# Patient Record
Sex: Male | Born: 1935 | Race: White | Hispanic: No | Marital: Married | Smoking: Never smoker
Health system: Southern US, Community
[De-identification: ages and names within clinical notes are randomized; demographics above are authoritative.]

## PROBLEM LIST (undated history)

## (undated) DIAGNOSIS — K219 Gastro-esophageal reflux disease without esophagitis: Secondary | ICD-10-CM

## (undated) DIAGNOSIS — I639 Cerebral infarction, unspecified: Secondary | ICD-10-CM

## (undated) DIAGNOSIS — E119 Type 2 diabetes mellitus without complications: Secondary | ICD-10-CM

## (undated) DIAGNOSIS — IMO0001 Reserved for inherently not codable concepts without codable children: Secondary | ICD-10-CM

## (undated) HISTORY — PX: COLON SURGERY: SHX602

## (undated) HISTORY — DX: Gastro-esophageal reflux disease without esophagitis: K21.9

## (undated) HISTORY — DX: Reserved for inherently not codable concepts without codable children: IMO0001

## (undated) HISTORY — DX: Cerebral infarction, unspecified: I63.9

## (undated) HISTORY — DX: Type 2 diabetes mellitus without complications: E11.9

---

## 2004-12-28 ENCOUNTER — Other Ambulatory Visit: Payer: Self-pay

## 2004-12-30 ENCOUNTER — Inpatient Hospital Stay: Payer: Self-pay | Admitting: Internal Medicine

## 2005-01-05 ENCOUNTER — Encounter: Payer: Self-pay | Admitting: Internal Medicine

## 2005-01-21 ENCOUNTER — Ambulatory Visit: Payer: Self-pay | Admitting: Internal Medicine

## 2005-01-24 ENCOUNTER — Encounter: Payer: Self-pay | Admitting: Internal Medicine

## 2005-12-11 ENCOUNTER — Other Ambulatory Visit: Payer: Self-pay

## 2005-12-11 ENCOUNTER — Inpatient Hospital Stay: Payer: Self-pay | Admitting: Internal Medicine

## 2007-09-03 ENCOUNTER — Other Ambulatory Visit: Payer: Self-pay

## 2007-09-03 ENCOUNTER — Emergency Department: Payer: Self-pay | Admitting: Emergency Medicine

## 2007-09-05 ENCOUNTER — Emergency Department: Payer: Self-pay | Admitting: Emergency Medicine

## 2007-09-05 ENCOUNTER — Inpatient Hospital Stay (HOSPITAL_COMMUNITY): Admission: AD | Admit: 2007-09-05 | Discharge: 2007-09-14 | Payer: Self-pay | Admitting: *Deleted

## 2007-09-06 ENCOUNTER — Encounter (INDEPENDENT_AMBULATORY_CARE_PROVIDER_SITE_OTHER): Payer: Self-pay | Admitting: *Deleted

## 2007-09-06 ENCOUNTER — Encounter (INDEPENDENT_AMBULATORY_CARE_PROVIDER_SITE_OTHER): Payer: Self-pay | Admitting: Neurology

## 2007-09-08 ENCOUNTER — Ambulatory Visit: Payer: Self-pay | Admitting: Vascular Surgery

## 2007-09-09 ENCOUNTER — Ambulatory Visit: Payer: Self-pay | Admitting: Physical Medicine & Rehabilitation

## 2007-09-14 ENCOUNTER — Ambulatory Visit: Payer: Self-pay | Admitting: Gastroenterology

## 2007-11-10 ENCOUNTER — Ambulatory Visit: Payer: Self-pay | Admitting: Urology

## 2007-11-14 ENCOUNTER — Ambulatory Visit: Payer: Self-pay | Admitting: Urology

## 2008-01-13 ENCOUNTER — Encounter: Payer: Self-pay | Admitting: Family Medicine

## 2008-01-25 ENCOUNTER — Encounter: Payer: Self-pay | Admitting: Family Medicine

## 2008-02-25 ENCOUNTER — Encounter: Payer: Self-pay | Admitting: Family Medicine

## 2008-03-23 ENCOUNTER — Emergency Department: Payer: Self-pay | Admitting: Emergency Medicine

## 2008-05-02 ENCOUNTER — Ambulatory Visit: Payer: Self-pay | Admitting: Family Medicine

## 2008-05-04 ENCOUNTER — Ambulatory Visit: Payer: Self-pay | Admitting: Family Medicine

## 2008-05-09 ENCOUNTER — Ambulatory Visit: Payer: Self-pay | Admitting: Family Medicine

## 2008-10-01 ENCOUNTER — Ambulatory Visit: Payer: Self-pay | Admitting: Family Medicine

## 2009-04-04 ENCOUNTER — Ambulatory Visit: Payer: Self-pay | Admitting: Gastroenterology

## 2009-04-09 ENCOUNTER — Ambulatory Visit: Payer: Self-pay | Admitting: Gastroenterology

## 2009-04-11 ENCOUNTER — Ambulatory Visit: Payer: Self-pay | Admitting: Gastroenterology

## 2009-04-22 ENCOUNTER — Inpatient Hospital Stay: Payer: Self-pay | Admitting: Surgery

## 2010-12-09 NOTE — H&P (Signed)
NAMEMANLY, NESTLE               ACCOUNT NO.:  [id number]   MEDICAL RECORD NO.:  [id number]          PATIENT TYPE:  INP   LOCATION:  3113                         FACILITY:  [facility]   PHYSICIAN:  Bevelyn Buckles. Champey, M.D.DATE OF BIRTH:  05/21/1921   DATE OF ADMISSION:  09/05/2007  DATE OF DISCHARGE:                              HISTORY & PHYSICAL   NEUROLOGY STROKE ADMIT NOTE   REQUESTING PHYSICIAN:  Dr. Christin Fudge from [facility].   REASON FOR ADMISSION:  Intercerebral hemorrhage.   HPI:  Mr. Blizard is an 77 year old Caucasian male with multiple medical  problems who initially presented to outside hospital today with  confusion and lethargy and weakness with inability to walk.  Patient was  found by his wife sitting on the floor unable to get up and walk.  He  went to an outside hospital where CT showed a large right frontal and  cerebral hemorrhage with edema and small vessel disease.  There was no  trauma documented by the family or patient.  He was transferred to [facility] for further care and treatment.  Patient is feeling well.  He denies any headache, focal weakness, numbness, vision changes, speech  changes, swallowing problems, chewing problems, dizziness, vertigo,  falls, or loss of consciousness.  Patient's INR at outside hospital was  3.9 and was given FFP x1 and vitamin K.  Patient was also at the outside  hospital on Saturday for lethargy and weakness, diagnosed with urinary  tract infection, and treated with antibiotics.   PAST MEDICAL HISTORY:  Positive for:  1. Atrial fibrillation.  2. Cerebral hemorrhage.   CURRENT MEDICATIONS:  1. Diltiazem.  2. Warfarin.  3. Nexium.  4. Multivitamin.   ALLERGIES:  PENICILLIN.   FAMILY HISTORY:  Noncontributory.   SOCIAL HISTORY:  Patient lives with wife.  Denies any tobacco or alcohol  use.   REVIEW OF SYSTEMS:  Positives as per HPI.  Review of systems negative as  per HPI in greater than 7  other organ systems.   EXAMINATION:  VITALS:  Temperature is 98.2.  Blood pressure is 161/60.  Pulse is 80.  Respirations 16.  O2 sat is 100%.  HEENT:  Normocephalic, atraumatic.  Extraocular muscles are intact.  Pupils equal, round, and reactive to light.  NECK:  Supple.  No carotid bruits are heard.  HEART:  Regular.  LUNGS:  Clear.  ABDOMEN:  Soft.  EXTREMITIES:  Show good pulses.  NEUROLOGICAL EXAMINATION:  Patient is awake, alert, language is fluent.  Patient is following commands appropriately.  Cranial nerves II-XII are  grossly intact.  MOTOR EXAMINATION:  Shows good strength in all 4 extremities.  No drift  is noted.SENSORY EXAMINATION:  Within normal limits to light touch.  Reflexes are trace to 1+ and symmetric.  Cerebellar function is within  normal limits, finger-to-nose.  Gait was not assessed secondary to  safety.   CT of the head at the outside hospital which was reviewed showed large  right frontal and cerebral hemorrhage with midline shift, edema.   LABS:  WBC is 12.1, hemoglobin 13.7, hematocrit is  40.7, platelets 275,  CK is 63, troponin is less than 0.01, TSH is 0.3, PT was 39.8, and INR  was 3.9 (this was prior to FFP and vitamin K given), sodium is 139,  potassium is 3.9, chloride is 102, CO2 is 28, BUN 25, creatinine is  1.54, glucose is 143, calcium is 9.0.  Her LFTs were within normal  limits.   IMPRESSION:  This is an 77 year old with right frontal and cerebral  hemorrhage transferred from an outside hospital for further care and  treatment.  We will admit the patient under stroke MD service and put  him in the intensive care unit with frequent neuro checks.  We will gets  the stats PT and INR since he is status post vitamin K and fresh frozen  plasma.  We will also get a stat CT of the head and compare with outside  hospital.  Patient looks remarkably well for this size of the hemorrhage  seen at the outside hospital.  We have consulted Dr. Franky Macho,   neurosurgery, for evaluation of intracerebral hemorrhage.  We will hold  all anticoagulation and antiplatelets.  We will follow his PT/INR.  We  will check an MRI of the brain.  Get physical therapy/occupational  therapy consults.  Keep the patient n.p.o. until he passes swallow  evaluation.  Place patient on intravenous fluids at normal saline at 100  mL per hour.  Can checks lipids and homocystine level.  Get 2D  echocardiogram and carotid Dopplers and I follow the patient while he is  in the hospital.      Bevelyn Buckles. Nash Shearer, M.D.  Electronically Signed     DRC/MEDQ  D:  09/05/2007  T:  09/06/2007  Job:  [id number]

## 2010-12-09 NOTE — Consult Note (Signed)
[name], [name]               ACCOUNT NO.:  [id number]   MEDICAL RECORD NO.:  [id number]          PATIENT TYPE:  INP   LOCATION:  3113                         FACILITY:  [facility]   PHYSICIAN:  Coletta Memos, M.D.     DATE OF BIRTH:  Feb 03, 1921   DATE OF CONSULTATION:  09/05/2007  DATE OF DISCHARGE:                                 CONSULTATION   REFERRING PHYSICIAN:  Bevelyn Buckles. Champey, MD   CHIEF COMPLAINT:  Right frontal intracerebral hematoma.   INDICATIONS:  [name] is an 77 year old gentleman who has long  history of medical problems including intracerebral hematomas and atrial  fibrillation who 2 days prior to admission to [facility] was seen for  weakness, and he was diagnosed with a urinary tract infection.  No  radiological evaluations were done at that time.  He was brought back to  the hospital by his wife stating that she had found him sitting on the  floor of the bathroom next to the toilet.  He denied falling.  Head CT  at [facility] showed a large intracerebral hematoma.  He has had these  symptoms since Saturday and has not worsened but did not show any  improvement.  He is on Coumadin.  He has an INR of 3.9 and a PT of 39.8.  He takes the Coumadin due to his history of atrial fibrillation.  His  wife felt that he was also weak on his left side and had some difficulty  walking.   His initial examination at [facility] showed he was alert, aroused,  answering questions, and he was appropriate.  No abnormalities were  identified with regards to that.  He was given vitamin K, labetalol and  acetaminophen.   He was transferred to the [facility] Stroke Service, and I was consulted  by Dr. Nash Shearer.   CURRENT MEDICATIONS:  Include diltiazem, Pacerone, Coumadin, Nexium,  vitamins Nature Plus, and he is taking an antibiotic for his urinary  tract infection.  Wife did not know the name of that.  He was also  placed on Phenergan and p.r.n. labetalol by the stroke service.   ALLERGIES:  PENICILLIN.   SOCIAL HISTORY:  He lives with his wife, does not smoke, does not use  alcohol.   PHYSICAL EXAMINATION:  VITAL SIGNS:  Blood pressure 156/60, pulse 80,  respiratory rate 23, oxygen saturation 99% on 2 L of O2 via nasal  cannula, temperature 100 degrees F.  NEUROLOGIC:  He is alert and oriented x4.  He answers all questions.  Speech is clear and fluent.  Mild drift in the left upper extremity.  He  has 5/5 strength in both upper and lower extremities.  Sensation is  intact to light touch.  Pupils are equal, round and reactive to light,  full extraocular movements.  He has symmetric facies, symmetric facial  movements.  Tongue and uvula are in the midline.  Hearing is intact to  voice.  NECK:  No cervical masses or bruits are appreciated.  LUNGS:  Lung fields are clear.  HEART:  Irregularly irregular rhythm.  ABDOMEN:  Soft,  nontender.  ADDITIONAL NEUROLOGIC:  Muscle tone and bulk are normal.  Coordination  appears to be normal.   Head CT findings are as aforementioned.   [name] will have a repeat head CT.  Head CT does show significant  amount of shift effacement of the right frontal horn, but his basal  cisterns are widely patent.  The shift is all more rostral and higher  up.  No skull fractures are seen.   ASSESSMENT/PLAN:  Right frontal intracerebral hematuria, unknown  etiology.  Essentially, it is in a place which is known for  hypertension.  He may have had an embolic phenomenon given the atrial  fibrillation, but he certainly was fully Coumadinized.   I do not believe that at this time he needs surgical decompression.  I  think the benefits would be greater than possible risks.  He is still  anticoagulated to some degree, not having enough __________, having any  sort of tentorial herniation, nor do I think it is likely he will have  that unless the clot were to change.  He has already received some  vitamin K, and I believe he will be  transfused fresh frozen plasma by  the stroke service.  I will follow [name].  He is due to receive a  repeat head CT tomorrow.           ______________________________  Coletta Memos, M.D.     KC/MEDQ  D:  09/05/2007  T:  09/06/2007  Job:  [id number]   cc:   Bevelyn Buckles. Nash Shearer, M.D.

## 2010-12-09 NOTE — Procedures (Signed)
CLINICAL HISTORY:  This is an 77 year old patient with a right frontal  hemorrhage.  The patient is confused and lethargic.  EEG done to  evaluate for seizure activity.  Medication listed Cardizem,  multivitamin, vitamin K, Avelox, Normodyne, Phenergan and Tylenol.   This is a portable study with the patient described as being lethargic.  Background rhythm consists of 6-7 Hz theta which is of moderate  amplitude and symmetric in both hemispheres.  Intermittent 5-6 Hz theta  slowing is seen focally in the left frontal and temporal head regions.  No definite epileptiform activity spikes are noted.  The patient did not  seem to be awake during this recording.  Length of the recording 22.8  minutes.  Technical component is average.  Hyperventilation is not  performed.  Photic stimulation unremarkable.   IMPRESSION:  This EEG is abnormal due to presence of bihemispheric  dysfunction as well as focal right hemispheric slowing suggestive of  underlying structural lesion.  No definite epileptiform features were  noted.           ______________________________  Sunny Schlein. Pearlean Brownie, MD     ZOX:WRUE  D:  09/06/2007 18:54:37  T:  09/08/2007 09:41:31  Job #:  [id number]

## 2010-12-09 NOTE — Discharge Summary (Signed)
NAMEFRITZ, [name]               ACCOUNT NO.:  [id number]   MEDICAL RECORD NO.:  [id number]          PATIENT TYPE:  INP   LOCATION:  3034                         FACILITY:  [facility]   PHYSICIAN:  Marlan Palau, M.D.  DATE OF BIRTH:  1920-08-14   DATE OF ADMISSION:  09/05/2007  DATE OF DISCHARGE:  09/14/2007                               DISCHARGE SUMMARY   DISCHARGE DIAGNOSES:  1. Right frontal intercerebral hemorrhage with left hematemesis      secondary to hypertension and warfarin as well as amyloid.  2. Ileus secondary to immobility in hospital, resolved.  3. Urinary retention, discharge with Foley.  4. Hypertension.  5. Atrial fibrillation.  6. History of cerebral hemorrhage.  7. Gastroesophageal reflux disease.   DISCHARGE MEDICATIONS:  1. Multivitamin a day.  2. Cardizem 120 mg ER a day.  3. Protonix 40 mg a day.  4. MiraLax 17 grams a day.  5. Flomax 0.4 mg a day.   STUDIES PERFORMED:  1. CT of the brain on admission shows right frontal hematoma with      vasogenic edema, mass effect with right-to-left shift.  2. MRI of the brain shows a large 56 x 38 x 37 mm vasogenic hemorrhage      with surrounding edema thought to be amyloid angiopathy event.  A 1      cm midline shift on this study compared to prior CT.  No abnormal      enhancement to suggest hemorrhagic neoplasm.  3. MRA of the head shows no proximal stenosis or vascular malformation      identified.  4. CT of the brain at 48 hours shows signs of 4.55 x 5.7 cm not      significantly changed since study two days ago.  The leftward      midline shift is measured at 11 mm with mass effect on the right      lateral ventricle.  Small volume intraventricular hemorrhage and      mild enlargement of the temporal horns.  5. Abdominal x-ray that shows marked colonic distention at least 14      cm.  6. Follow-up abdominal x-ray after rectal tube shows decompression of      the entire colon, no acute abnormalities.  7. EEG shows bihemispheric dysfunction and focal right hemispheric      slowing suggestive of underlying structural lesion, no epileptiform      activities.  8. Carotid Doppler shows no ICA stenosis, left vertebral artery flow      antegrade, right vertebral artery not seen.  9. Lower extremity Dopplers are normal bilaterally at rest.  Great toe      pressures indicate adequate perfusion bilaterally.  10.A 2-D echocardiogram shows EF of 55-65% with no left ventricular      regional wall motion abnormalities.  No embolic source.  11.EKG not present in chart.   LABORATORY STUDIES:  CBC with hemoglobin 11.5, hematocrit 34, otherwise  normal.  Chemistry with potassium 3.4, glucose 125, otherwise normal.  Last INR performed on September 08, 2007, was 1.3.   HISTORY OF PRESENT  ILLNESS:  [name] is an 77 year old Caucasian male  with multiple medical problems who presented to an outside hospital with  confusion and lethargy and weakness with inability to walk.  The patient  was found by his wife sitting on the floor unable to get up and walk.  At the outside hospital, CT showed a large right frontal and cerebral  hemorrhage with edema and small vessel disease.  There was no trauma  documented by the patient or family.  He was transferred to [facility] for further care and treatment.  Upon arrival,  the patient states he is feeling well.  His INR at the outside hospital  was 3.9 and he was given fresh frozen plasma x1 and vitamin K.  Of note,  the patient was also at the outside hospital on Saturday for lethargy  and weakness and he was diagnosed with urinary tract infection and  placed on antibiotics.  We will admit him to the neuro intensive care  unit and continued to reverse his INR was vitamin K and fresh frozen  plasma.  Dr. Franky Macho, neurosurgeon, has been consulted to evaluate the  hemorrhage.  He was not a TPA candidate secondary to his large  hemorrhage.    HOSPITAL COURSE:  There was some increase in hemorrhage size within the  first 24-48 hours but not significant or with neuro change.  He was  given I think a total of 5 units of fresh frozen plasma and vitamin K  with eventual reversal of INR down to 1.3.  The neurosurgeon continue to  follow and throughout hospitalization.  However he never had a need for  surgical intervention.  They were seriously considering craniotomy at  one-time but as his neuro status improved, that was stopped.  The  patient was then transferred to the floor.  Modified barium swallow was  performed and found patient needed a dysphasia II chopped meat, thin  liquid diet.  He was evaluated by PT, OT and speech therapy and felt to  be a great candidate for inpatient rehab.  However, his family is unable  to provide care at time of discharge to SNF was opted for.  While  determining discharge plan, the patient developed severe ileus measuring  14 cm at risk for rupture.  Rectal tube was placed and rapidly  decompressed the colon.  GI was consulted.  They felt the ileus was  secondary to immobility due to hospitalization and would resolve with  medical intervention only.  At the time of discharge the patient is  tolerating a diet and his bowels have moved.  The patient also with  difficulty with urinary retention likely due to Foley insertion and  removal as well as his age.  We have added Flomax during  hospitalization.  At nursing home request, will place Foley at discharge  and they will begin bladder retraining program there.  Patient is stable  for discharge.  Bed obtained and arrangements made.   CONDITION ON DISCHARGE:  The patient alert and cooperative.  He moves  all extremities.  He has full visual fields.  He follows commands.  His  chest is clear to auscultation.  His heart rate is regular.  He has mild  left facial droop with left hematemesis that is mild.  His abdomen is  soft, large, nondistended and  with decreased bowel sounds.  His bladder  is nondistended.   DISCHARGE/PLAN:  1. Discharge to skilled nursing facility for continuation  of PT, OT      and speech therapy for stroke recovery.  2. No antiplatelets at this point secondary to hemorrhage.  3. Monitor p.o. intake and bowel movements secondary to recent ileus.  4. Patient needs bladder retraining for urinary retention.  The      patient discharged with Foley.  5. Follow-up with primary care physician at skilled nursing facility      for risk factor control.  6. Follow-up Pramod P. Pearlean Brownie, MD, in his office in 2-3 months.      Annie Main, N.P.      Marlan Palau, M.D.  Electronically Signed    SB/MEDQ  D:  09/14/2007  T:  09/14/2007  Job:  [id number]   cc:   Pramod P. Pearlean Brownie, MD  Coletta Memos, M.D.  Rachael Fee, MD  Bjorn Pippin, M.D.

## 2011-04-17 LAB — CBC
Hemoglobin: 11.5 — ABNORMAL LOW
MCHC: 33.9
MCV: 92.4
Platelets: 229
RBC: 3.67 — ABNORMAL LOW
RDW: 13.3
WBC: 12.7 — ABNORMAL HIGH

## 2011-04-17 LAB — URINE MICROSCOPIC-ADD ON

## 2011-04-17 LAB — TYPE AND SCREEN

## 2011-04-17 LAB — URINE CULTURE
Colony Count: NO GROWTH
Culture: NO GROWTH

## 2011-04-17 LAB — PREPARE FRESH FROZEN PLASMA

## 2011-04-17 LAB — COMPREHENSIVE METABOLIC PANEL
AST: 27
Albumin: 3.7
BUN: 20
Calcium: 9.1
Creatinine, Ser: 1.29
GFR calc Af Amer: >60
Total Protein: 7

## 2011-04-17 LAB — LIPID PANEL
Cholesterol: 143
HDL: 36 — ABNORMAL LOW
LDL Cholesterol: 92
Total CHOL/HDL Ratio: 4

## 2011-04-17 LAB — PROTIME-INR
INR: 1.4
INR: 1.6 — ABNORMAL HIGH
INR: 2.4 — ABNORMAL HIGH
Prothrombin Time: 16.7 — ABNORMAL HIGH
Prothrombin Time: 26.6 — ABNORMAL HIGH

## 2011-04-17 LAB — CULTURE, BLOOD (ROUTINE X 2): Culture: NO GROWTH

## 2011-04-17 LAB — BASIC METABOLIC PANEL
CO2: 27
Calcium: 8.4
GFR calc Af Amer: >60
GFR calc non Af Amer: >60
Potassium: 3.4 — ABNORMAL LOW
Sodium: 144

## 2011-04-17 LAB — URINALYSIS, ROUTINE W REFLEX MICROSCOPIC
Glucose, UA: NEGATIVE
Ketones, ur: NEGATIVE
Leukocytes, UA: NEGATIVE

## 2011-04-17 LAB — PHOSPHORUS: Phosphorus: 3.2

## 2011-04-17 LAB — CALCIUM: Calcium: 8.3 — ABNORMAL LOW

## 2011-04-17 LAB — APTT
aPTT: 47 — ABNORMAL HIGH
aPTT: 47 — ABNORMAL HIGH

## 2011-06-08 ENCOUNTER — Ambulatory Visit: Payer: Self-pay | Admitting: Family Medicine

## 2011-07-01 ENCOUNTER — Ambulatory Visit: Payer: Self-pay | Admitting: Surgery

## 2011-07-04 ENCOUNTER — Inpatient Hospital Stay: Payer: Self-pay | Admitting: Surgery

## 2011-07-07 LAB — PATHOLOGY REPORT

## 2012-01-22 ENCOUNTER — Encounter (INDEPENDENT_AMBULATORY_CARE_PROVIDER_SITE_OTHER): Payer: Medicare Other | Admitting: Ophthalmology

## 2012-01-22 DIAGNOSIS — H3530 Unspecified macular degeneration: Secondary | ICD-10-CM

## 2012-01-22 DIAGNOSIS — H43819 Vitreous degeneration, unspecified eye: Secondary | ICD-10-CM

## 2012-01-22 DIAGNOSIS — H2510 Age-related nuclear cataract, unspecified eye: Secondary | ICD-10-CM

## 2012-07-29 ENCOUNTER — Ambulatory Visit (INDEPENDENT_AMBULATORY_CARE_PROVIDER_SITE_OTHER): Payer: Medicare Other | Admitting: Ophthalmology

## 2012-07-29 DIAGNOSIS — E1139 Type 2 diabetes mellitus with other diabetic ophthalmic complication: Secondary | ICD-10-CM

## 2012-07-29 DIAGNOSIS — H3530 Unspecified macular degeneration: Secondary | ICD-10-CM

## 2012-07-29 DIAGNOSIS — H35039 Hypertensive retinopathy, unspecified eye: Secondary | ICD-10-CM

## 2012-07-29 DIAGNOSIS — E11319 Type 2 diabetes mellitus with unspecified diabetic retinopathy without macular edema: Secondary | ICD-10-CM

## 2012-07-29 DIAGNOSIS — I10 Essential (primary) hypertension: Secondary | ICD-10-CM

## 2012-07-29 DIAGNOSIS — H43819 Vitreous degeneration, unspecified eye: Secondary | ICD-10-CM

## 2013-01-23 ENCOUNTER — Ambulatory Visit: Payer: Self-pay | Admitting: Family Medicine

## 2013-07-31 ENCOUNTER — Ambulatory Visit (INDEPENDENT_AMBULATORY_CARE_PROVIDER_SITE_OTHER): Payer: Medicare Other | Admitting: Ophthalmology

## 2013-07-31 DIAGNOSIS — H3530 Unspecified macular degeneration: Secondary | ICD-10-CM

## 2013-07-31 DIAGNOSIS — H43819 Vitreous degeneration, unspecified eye: Secondary | ICD-10-CM

## 2013-07-31 DIAGNOSIS — I10 Essential (primary) hypertension: Secondary | ICD-10-CM

## 2013-07-31 DIAGNOSIS — H35039 Hypertensive retinopathy, unspecified eye: Secondary | ICD-10-CM

## 2013-07-31 DIAGNOSIS — E1139 Type 2 diabetes mellitus with other diabetic ophthalmic complication: Secondary | ICD-10-CM

## 2013-07-31 DIAGNOSIS — E1165 Type 2 diabetes mellitus with hyperglycemia: Secondary | ICD-10-CM

## 2013-07-31 DIAGNOSIS — E11319 Type 2 diabetes mellitus with unspecified diabetic retinopathy without macular edema: Secondary | ICD-10-CM

## 2013-08-04 ENCOUNTER — Ambulatory Visit (INDEPENDENT_AMBULATORY_CARE_PROVIDER_SITE_OTHER): Payer: Medicare Other | Admitting: Ophthalmology

## 2013-11-13 ENCOUNTER — Ambulatory Visit (INDEPENDENT_AMBULATORY_CARE_PROVIDER_SITE_OTHER): Payer: Medicare Other | Admitting: Podiatry

## 2013-11-13 ENCOUNTER — Encounter: Payer: Self-pay | Admitting: Podiatry

## 2013-11-13 VITALS — BP 132/78 | HR 88 | Resp 16 | Ht 72.0 in | Wt 235.0 lb

## 2013-11-13 DIAGNOSIS — M79609 Pain in unspecified limb: Secondary | ICD-10-CM

## 2013-11-13 DIAGNOSIS — B351 Tinea unguium: Secondary | ICD-10-CM

## 2013-11-13 NOTE — Progress Notes (Signed)
   Subjective:    Patient ID: Tracy HeckRobert S Wandel, male    DOB: [birth year], 80 y.o.   MRN: [id number]  HPI Comments: To get my toenails cut     Review of Systems     Objective:   Physical Exam: He presents today for routine footcare. Pulses are palpable bilateral. Nails are grossly elongated severely mycotic and severely incurvated. Currently there are no Specter all infections around the margins of the nail today do demonstrate onychomycosis. Pain in limb associated with the onychomycosis.       Assessment & Plan:  Assessment: Pain in limb secondary to onychomycosis 1 through 5 bilateral.  Plan: Debridement of nails 1 through 5 bilateral

## 2014-06-11 ENCOUNTER — Inpatient Hospital Stay: Payer: Self-pay | Admitting: Internal Medicine

## 2014-06-11 LAB — URINALYSIS, COMPLETE
BLOOD: NEGATIVE
Bilirubin,UR: NEGATIVE
Glucose,UR: NEGATIVE mg/dL (ref 0–75)
KETONE: NEGATIVE
Nitrite: POSITIVE
PH: 6 (ref 4.5–8.0)
Protein: NEGATIVE
RBC,UR: 2 /HPF (ref 0–5)
SPECIFIC GRAVITY: 1.03 (ref 1.003–1.030)
Squamous Epithelial: <1
WBC UR: 132 /HPF (ref 0–5)

## 2014-06-11 LAB — COMPREHENSIVE METABOLIC PANEL
ALBUMIN: 3.6 g/dL (ref 3.4–5.0)
ALT: 26 U/L
AST: 31 U/L (ref 15–37)
Alkaline Phosphatase: 129 U/L — ABNORMAL HIGH
Anion Gap: 6 — ABNORMAL LOW (ref 7–16)
BUN: 21 mg/dL — ABNORMAL HIGH (ref 7–18)
Bilirubin,Total: 0.8 mg/dL (ref 0.2–1.0)
CALCIUM: 8.6 mg/dL (ref 8.5–10.1)
CO2: 28 mmol/L (ref 21–32)
Chloride: 109 mmol/L — ABNORMAL HIGH (ref 98–107)
Creatinine: 1.3 mg/dL (ref 0.60–1.30)
EGFR (African American): >60
EGFR (Non-African Amer.): 55 — ABNORMAL LOW
Glucose: 144 mg/dL — ABNORMAL HIGH (ref 65–99)
OSMOLALITY: 290 (ref 275–301)
Potassium: 4.1 mmol/L (ref 3.5–5.1)
SODIUM: 143 mmol/L (ref 136–145)
Total Protein: 7.7 g/dL (ref 6.4–8.2)

## 2014-06-11 LAB — CK: CK, TOTAL: 285 U/L

## 2014-06-11 LAB — CBC
HCT: 38 % — AB (ref 40.0–52.0)
HGB: 12.5 g/dL — AB (ref 13.0–18.0)
MCH: 31.5 pg (ref 26.0–34.0)
MCHC: 32.9 g/dL (ref 32.0–36.0)
MCV: 96 fL (ref 80–100)
Platelet: 263 10*3/uL (ref 150–440)
RBC: 3.97 10*6/uL — ABNORMAL LOW (ref 4.40–5.90)
RDW: 13.7 % (ref 11.5–14.5)
WBC: 10.5 10*3/uL (ref 3.8–10.6)

## 2014-06-11 LAB — TROPONIN I

## 2014-06-11 LAB — LIPASE, BLOOD: Lipase: 33 U/L — ABNORMAL LOW (ref 73–393)

## 2014-06-11 LAB — CK-MB: CK-MB: 2.8 ng/mL (ref 0.5–3.6)

## 2014-06-12 LAB — TROPONIN I: Troponin-I: <0.02 ng/mL

## 2014-06-12 LAB — BASIC METABOLIC PANEL
Anion Gap: 8 (ref 7–16)
BUN: 20 mg/dL — ABNORMAL HIGH (ref 7–18)
Calcium, Total: 8.3 mg/dL — ABNORMAL LOW (ref 8.5–10.1)
Chloride: 108 mmol/L — ABNORMAL HIGH (ref 98–107)
Co2: 26 mmol/L (ref 21–32)
Creatinine: 1.28 mg/dL (ref 0.60–1.30)
GFR CALC NON AF AMER: 56 — AB
GLUCOSE: 157 mg/dL — AB (ref 65–99)
Osmolality: 289 (ref 275–301)
POTASSIUM: 3.8 mmol/L (ref 3.5–5.1)
SODIUM: 142 mmol/L (ref 136–145)

## 2014-06-12 LAB — CK-MB: CK-MB: 2.1 ng/mL (ref 0.5–3.6)

## 2014-06-13 LAB — URINE CULTURE

## 2014-06-28 ENCOUNTER — Emergency Department: Payer: Self-pay | Admitting: Emergency Medicine

## 2014-06-29 ENCOUNTER — Emergency Department: Payer: Self-pay | Admitting: Student

## 2014-06-29 LAB — APTT: Activated PTT: 34 secs (ref 23.6–35.9)

## 2014-06-29 LAB — COMPREHENSIVE METABOLIC PANEL
ALT: 21 U/L
ANION GAP: 12 (ref 7–16)
Albumin: 2.9 g/dL — ABNORMAL LOW (ref 3.4–5.0)
Alkaline Phosphatase: 175 U/L — ABNORMAL HIGH
BUN: 21 mg/dL — ABNORMAL HIGH (ref 7–18)
Bilirubin,Total: 0.5 mg/dL (ref 0.2–1.0)
CO2: 23 mmol/L (ref 21–32)
Calcium, Total: 8.4 mg/dL — ABNORMAL LOW (ref 8.5–10.1)
Chloride: 109 mmol/L — ABNORMAL HIGH (ref 98–107)
Creatinine: 1.24 mg/dL (ref 0.60–1.30)
EGFR (Non-African Amer.): 58 — ABNORMAL LOW
Glucose: 166 mg/dL — ABNORMAL HIGH (ref 65–99)
Osmolality: 294 (ref 275–301)
Potassium: 3.9 mmol/L (ref 3.5–5.1)
SGOT(AST): 32 U/L (ref 15–37)
Sodium: 144 mmol/L (ref 136–145)
TOTAL PROTEIN: 7.1 g/dL (ref 6.4–8.2)

## 2014-06-29 LAB — CBC
HCT: 36.7 % — ABNORMAL LOW (ref 40.0–52.0)
HGB: 12.3 g/dL — ABNORMAL LOW (ref 13.0–18.0)
MCH: 31.5 pg (ref 26.0–34.0)
MCHC: 33.4 g/dL (ref 32.0–36.0)
MCV: 94 fL (ref 80–100)
PLATELETS: 344 10*3/uL (ref 150–440)
RBC: 3.9 10*6/uL — ABNORMAL LOW (ref 4.40–5.90)
RDW: 13.4 % (ref 11.5–14.5)
WBC: 12.3 10*3/uL — AB (ref 3.8–10.6)

## 2014-06-29 LAB — PROTIME-INR
INR: 1.1
PROTHROMBIN TIME: 14.4 s (ref 11.5–14.7)

## 2014-06-29 LAB — TROPONIN I

## 2014-08-01 ENCOUNTER — Ambulatory Visit (INDEPENDENT_AMBULATORY_CARE_PROVIDER_SITE_OTHER): Payer: Medicare Other | Admitting: Ophthalmology

## 2014-08-27 DEATH — deceased

## 2014-11-17 NOTE — Consult Note (Signed)
Brief Consult Note: Diagnosis: acute T9 compression fracture.   Patient was seen by consultant.   Comments: will discuss kyphoplasty with model and booklet tomorrow.  Colostomy makes bracing difficult.  Electronic Signatures: Leitha SchullerMenz, Brittany Amirault J (MD)  (Signed 17-Nov-15 19:48)  Authored: Brief Consult Note   Last Updated: 17-Nov-15 19:48 by Leitha SchullerMenz, Aubria Vanecek J (MD)

## 2014-11-17 NOTE — Op Note (Signed)
PATIENT NAME:  [name], [name] MR#:  [id number] DATE OF BIRTH:  [birth year]  DATE OF PROCEDURE:  06/14/2014  PREOPERATIVE DIAGNOSIS: T9 compression fracture.   POSTOPERATIVE DIAGNOSIS: T9 compression fracture.   PROCEDURE: T9 kyphoplasty.   ANESTHESIA: MAC.   SURGEON: Kennedy BuckerMichael Eulla Kochanowski, MD    DESCRIPTION OF PROCEDURE: The patient was brought to the Operating Room and after adequate sedation was given, the patient was placed prone, and C-arm was brought in and good visualization of the T9 vertebral body was found with both AP and lateral imaging.  The skin was prepped with alcohol and after patient identification and timeout procedure were completed, 10 mL of 1% Xylocaine was infiltrated subcutaneously for planned portal.  Next, the back was prepped and draped in the usual sterile fashion and along the C-arm.  Repeat timeout procedure completed. A spinal needle was inserted down to the pedicle on the right side at T9, and local anesthetic, a mixture of 1% Xylocaine. 0.5% Sensorcaine with epinephrine, infiltrated along the tract to the skin. Small stab incision was made, and the express trocar was used to enter the pedicle laterally and careful advancement was made being certain not to penetrate the medial wall of the pedicle, getting into the vertebral body.  A vertebral body biopsy was obtained. Drilling was then carried out, followed by placement of a balloon which was inflated without a great deal of pressure.  Cement was then mixed and inserted into the center aspect. It did cross the midline and gave good fill to the inferior half of the vertebral body. After this had been placed, the trocar was removed and permanent C-arm views were obtained. The wound was closed with Dermabond and then covered with a Band-Aid. The patient was sent to the recovery room in stable condition.   ESTIMATED BLOOD LOSS: Minimal.   COMPLICATIONS: None.   SPECIMEN: T9 vertebral body biopsy.   PATIENT NAME: [name]    \  ____________________________ Leitha SchullerMichael J. Kenna Kirn, MD mjm:DT D: 06/14/2014 21:20:31 ET T: 06/15/2014 08:59:06 ET JOB#: [id number]  cc: Leitha SchullerMichael J. Judyth Demarais, MD, <Dictator> Leitha SchullerMICHAEL J Lakesia Dahle MD ELECTRONICALLY SIGNED 06/17/2014 9:15

## 2014-11-17 NOTE — Discharge Summary (Signed)
PATIENT NAME:  [name], [name] MR#:  [id number] DATE OF BIRTH:  [birth year]  DATE OF ADMISSION:  06/11/2014 DATE OF DISCHARGE:  06/14/2014  PRIMARY CARE PHYSICIAN: Lorie Phenix, MD  FINAL DIAGNOSES: 1.  Acute T9 compression fracture status post kyphoplasty.  2.  Clinical sepsis with extended-spectrum beta-lactamase Escherichia coli.  3.  Debility.  4.  Atrial fibrillation.  5.  Diabetes.  6.  Multiple falls.   MEDICATIONS ON DISCHARGE: Include Nexium 40 mg daily, diltiazem ER 120 mg daily, Flomax 0.4 mg in the morning, metformin 500 mg twice a day, multivitamin 1 tablet daily, glipizide extended-release 5 mg daily, aspirin 81 mg daily, Ocuvite 1 tablet daily, Januvia 50 mg daily, acetaminophen/hydrocodone 5/325 one tablet every 6 hours as needed for moderate pain, acetaminophen 325 mg 2 tablets every 4 hours as needed for mild pain, ertapenem 1 gram IV every 24 hours for 7 days, MiraLax 17 grams orally once a day as needed for constipation, normal saline line flush 5 mL injectable as needed for line patency, 5 mg injectable once a day, line flush with heparin 10 units/mL 5 mL injectable as needed for line patency, line flush with heparin 50 units injection 5 mL injectable once a day, hydrochlorothiazide 25 mg daily.   DISCHARGE DIET: Low sodium, carbohydrate-controlled diet, regular consistency.   DISCHARGE ACTIVITY: As tolerated.   DISCHARGE FOLLOWUP: Follow up with physical therapy, 1 to 2 days with doctor at rehab.   HISTORY: The patient was admitted June 11, 2014 and discharged June 15, 2014.   LABORATORY AND RADIOLOGICAL DATA DURING THE HOSPITAL COURSE: Included a troponin that was negative. CPK negative. Glucose 144, BUN 21, creatinine 1.3, sodium 143, potassium 4.1, chloride 109, CO2 28, calcium 8.6. Liver function tests: Alkaline phosphatase slightly high at 129. Other liver function tests normal range. Lipase 33. White blood cell count 10.5, H and H 12.5 and 38, platelet  count 263,000.   EKG: Normal sinus rhythm, incomplete right bundle branch block, left anterior fascicular block.  CT scan of the abdomen and pelvis showed acute appearing T9 compression fracture, interval sigmoid resection and colostomy, herniation of small bowel. No incarceration or obstruction. Two small ventral hernias.   CT scan of the head: Minimal diffuse cortical atrophy.  Urine culture grew out greater than 100,000 E. coli, ESBL. Urinalysis: 3+ leukocyte esterase.   Next 2 troponins were negative.   MRI of the thoracic spine showed horizontal fracture through the inferior T9 vertebral body. Fluid signal in the fracture cleft. Thoracic spine shows vertebral augmentation changes at T9.   PHYSICAL EXAMINATION: VITAL SIGNS: Included temperature 98.4, pulse 79, respirations 20, blood pressure ranging between 166/82 and 181/94, pulse ox 92 on room air  LUNGS: Clear.  HEART: S1 and S2 irregular, irregular. No gallops, rubs, or murmurs heard. Edema, 3+ lower extremity.  ABDOMEN: Soft, nontender.  HOSPITAL COURSE PER PROBLEM LIST:  1.  For the patient's acute T9 compression fracture, the patient is status post kyphoplasty by Dr. Rosita Kea on June 14, 2014. Follow up with orthopedics in 2 weeks, Dr. Rosita Kea. Pain control with oxycodone p.r.n.  2.  Clinical sepsis with ESBL E. coli. The patient has improved. Antibiotics initially were given, then switched over to ertapenem with the resistant organism. We will place a PICC line. PICC line care needed. Remove PICC line once completed course of antibiotics. We will give IV ertapenem for another 7 days, which could be done at rehab.  3.  Debility. Follow-up with physical therapy at rehab.  4.  Atrial fibrillation, rate controlled. Aspirin for anticoagulation.  5.  Diabetes. On oral medications. Check finger sticks before meals and at bedtime.  6.  Multiple falls. Needs physical therapy.  TIME SPENT ON DISCHARGE: 35 minutes.   ____________________________ Herschell Dimesichard J. Renae GlossWieting, MD rjw:sb D: 06/15/2014 09:06:23 ET T: 06/15/2014 09:27:43 ET JOB#: [id number]  cc: Herschell Dimesichard J. Renae GlossWieting, MD, <Dictator> Leo GrosserNancy J. Maloney, MD [facility]  Salley ScarletICHARD J Dinari Stgermaine MD ELECTRONICALLY SIGNED 06/15/2014 16:32

## 2014-11-17 NOTE — H&P (Signed)
PATIENT NAME:  [name], Tracy S MR#:  [id number] DATE OF BIRTH:  09-06-1920  DATE OF ADMISSION:  06/11/2014  PRIMARY CARE PHYSICIAN:  Leo GrosserNancy J. Maloney, MD  REFERRING PHYSICIAN:  Kathreen DevoidKevin A. Paduchowski, MD  CHIEF COMPLAINT:  Fall, back pain.   HISTORY OF PRESENT ILLNESS:  [name] is a 81 year old male with a history of hypertension, diabetes mellitus, atrial fibrillation, and previous history of stroke, who had a fall on Saturday but was able to get up and walk around, however, was experiencing some back pain. Again, the patient fell down on Sunday. The patient could not stand up. The patient's wife also fell down; however, she was able to stand up and walk around. The patient had decided to stay on the floor and slept overnight on the floor. The following day, the patient was unable to stand up. Concerning this, EMS was called. When EMS arrived, the patient refused to come to the Emergency Department. As the patient was trying to get up, he could not. They again called EMS, and he was brought to the Emergency Department. On workup in the Emergency Department, the patient is found to have a urinary tract infection. Denies having any cough or shortness of breath. Denies having any fever. He did not have any elevated white blood cell count. The patient received levofloxacin in the Emergency Department. The patient is found to have a T9 compression fracture.   PAST MEDICAL HISTORY: 1.  Hypertension.  2.  Hyperlipidemia.  3.  Diabetes mellitus.  4.  Atrial fibrillation.  5.  Previous history of CVA.  6.  Nephrolithiasis.  7.  Benign prostatic hypertrophy.   PAST SURGICAL HISTORY:  1.  Prostate procedures.  2.  Cholecystectomy.  3.  Colon resection with colostomy.   ALLERGIES TO:  PENICILLIN.   HOME MEDICATIONS: 1.  Tamsulosin 0.4 mg once a day.  2.  Ocuvite 1 capsule once a day.  3.  Nexium 40 mg once a day.  4.  Multivitamin 1 tablet once a day.  5.  Metformin 500 mg 2 times a day.  6.   Januvia 50 mg once a day.  7.  Glipizide extended release 1 tablet once a day.  8.  Diltiazem 120 mg daily.  9.  Aspirin 81 mg daily.   SOCIAL HISTORY:  Former smoker. Denies drinking alcohol or using illicit drugs. Married and lives with his wife. He is independent of ADLs and IADLs even though he walks slowly with the help of a cane.   FAMILY HISTORY:  Noncontributory at the age of 81; however, father died from cancer of unknown site. Mother had stroke. Sister with a history of stroke.   REVIEW OF SYSTEMS: CONSTITUTIONAL:  Experiencing generalized weakness.  EYES:  No change in vision.  EARS, NOSE, AND THROAT:  No change in hearing. RESPIRATORY:  No cough or shortness of breath.  CARDIOVASCULAR:  No chest pain or palpations.  GASTROINTESTINAL:  No nausea, vomiting, or abdominal pain.  GENITOURINARY:  No dysuria or hematuria.  HEMATOLOGIC:  No easy bruising or bleeding.  SKIN:  No rash or lesions.  MUSCULOSKELETAL:  Has back pain.  NEUROLOGIC:  No weakness or numbness in any part of the body.  PHYSICAL EXAMINATION: GENERAL:  This is a well-built, well-nourished, obese male lying down in the bed, not in distress.  VITAL SIGNS:  Temperature normal, pulse 80, blood pressure , respiratory rate 18, and oxygen saturation is 96% on room air.  HEENT:  Head is normocephalic and  atraumatic. There is no scleral icterus. Conjunctivae are normal. Pupils are equal and react to light. Mucous membranes:  Mild dryness. No pharyngeal erythema.  NECK:  Supple. No lymphadenopathy. No JVD. No carotid bruit.  CHEST:  Has no focal tenderness.  LUNGS:  Bilaterally clear to auscultation.  HEART:  S1, S2 regular. No murmurs are heard.  ABDOMEN:  Bowel sounds present. Soft, nondistended. Mild tenderness in the epigastric area. I cannot appreciate any hepatosplenomegaly.  EXTREMITIES:  No pedal edema. Pulses are 2+.  NEUROLOGIC:  The patient is alert and oriented to place, person, and time. Cranial nerves II  through XII are intact. Motor is 5/5 in upper and lower extremities.   LABORATORY DATA:  CMP:  BUN 21, creatinine 1.3, the rest of all the values are within normal limits. CK is 285. Troponin is less than 0.02.   IMAGING:  CT of the head without contrast:  No intracranial abnormalities. CT of the abdomen and pelvis with contrast shows acute-appearing mildly displaced fracture through the inferior endplate of T9-T10 disk space. This is a potentially unstable injury. Interval sigmoid resection with colostomy. There is a parastomal herniation of the small bowel.  ASSESSMENT AND PLAN:  1.  Recurrent falls.  Could be from the urinary tract infection. T9 compression fracture commented as unstable injury. We will keep the patient on bedrest and consult orthopedic surgery in the morning. The patient may benefit from kyphoplasty considering the patient's history of multiple medical problems, instability, and age.  2.  Urinary tract infection. Send urine for cultures. Continue with Rocephin.  3.  Severe debility. We will involve physical therapy.  4.  Atrial fibrillation. Rate is well controlled. Continue with the metoprolol and Cardizem.  5.  Diabetes mellitus on multiple oral medications. We will hold these for now. Continue the sliding scale insulin.  6.  Recurrent falls and the patient being on the floor. We will check CPK level, continue with IV fluids, and follow up.  7.  We will keep the patient on deep vein thrombosis prophylaxis with Lovenox.   TIME SPENT:  55 minutes.    ____________________________ Susa Griffins, MD pv:nb D: 06/11/2014 21:52:10 ET T: 06/11/2014 23:21:12 ET JOB#: [id number]  cc: Susa Griffins, MD, <Dictator> Susa Griffins MD ELECTRONICALLY SIGNED 06/22/2014 22:37

## 2014-11-19 LAB — SURGICAL PATHOLOGY

## 2015-07-27 IMAGING — CT CT HEAD WITHOUT CONTRAST
1 series · 12 of 14 positions shown, 15 images · non-contrast
Comparison: 06/11/2014

CLINICAL DATA: Right-sided weakness, status post fall yesterday,
cannot move right arm

EXAM:
CT HEAD WITHOUT CONTRAST
TECHNIQUE: Contiguous axial images were obtained from the base of the skull
through the vertex without intravenous contrast.

[Series 2: head wo · axial · 0.45mm/px · z∈[-26,+114]mm · 12 of 34 slices shown, 15 images]
[im 3/34  soft-tissue]
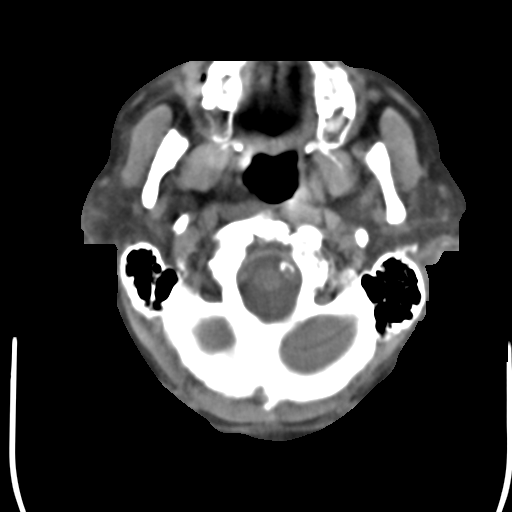
[im 3/34  bone]
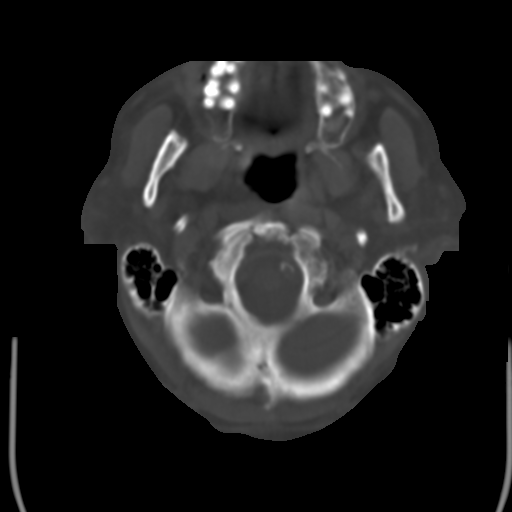
[im 6/34  bone]
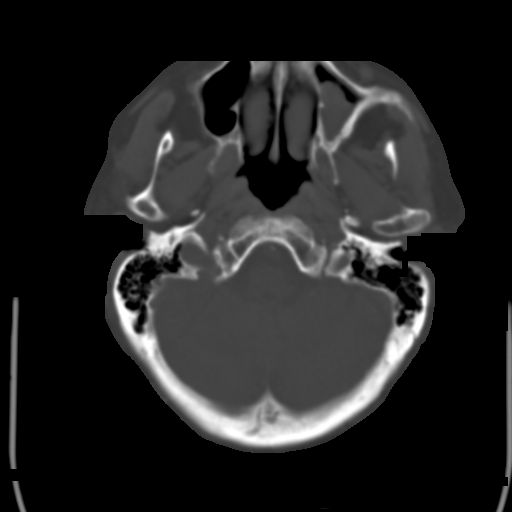
[im 8/34  bone]
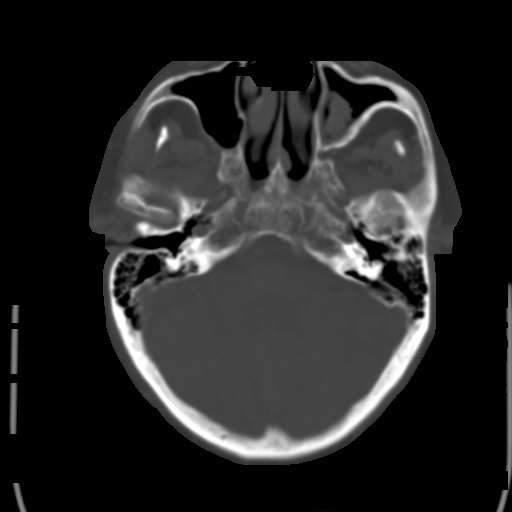
[im 11/34  bone]
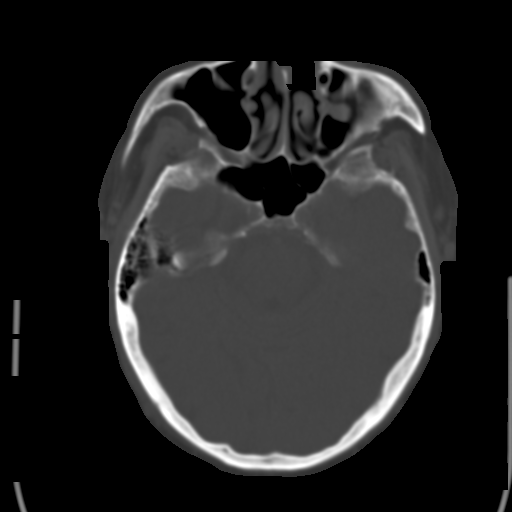
[im 13/34  soft-tissue]
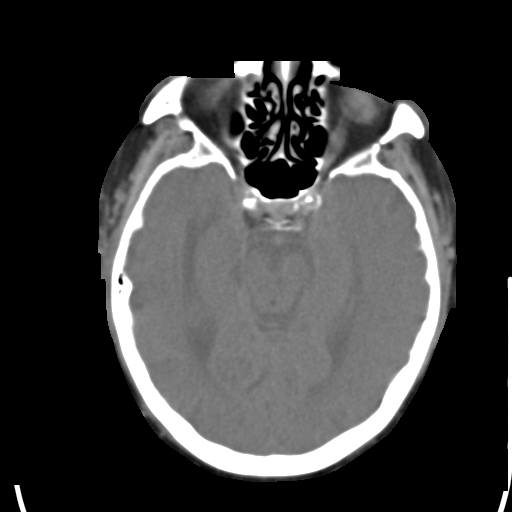
[im 13/34  bone]
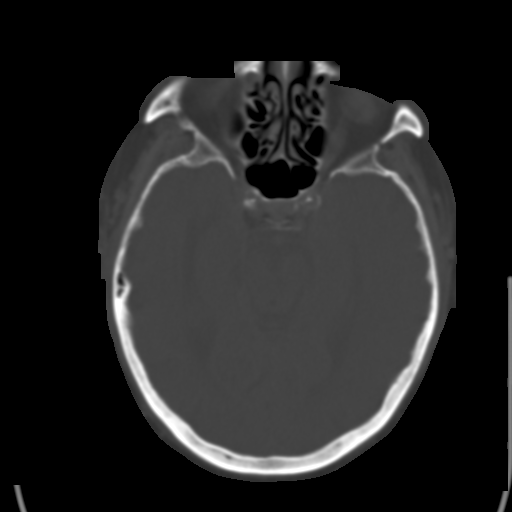
[im 16/34  bone]
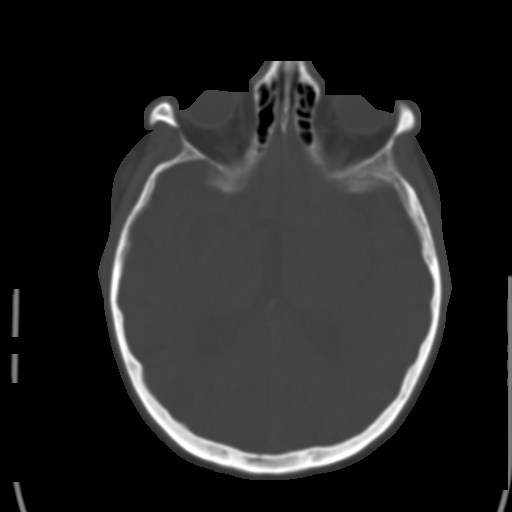
[im 18/34  bone]
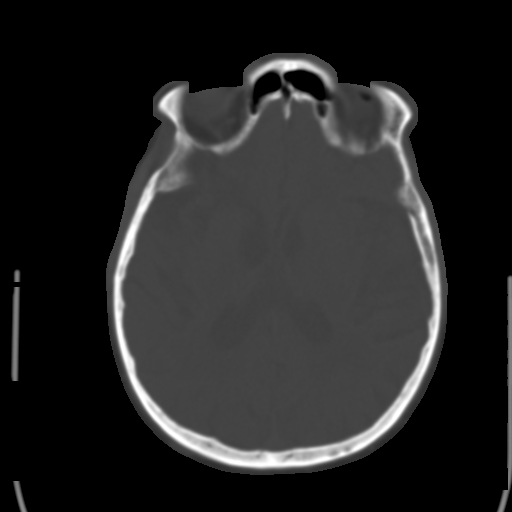
[im 21/34  bone]
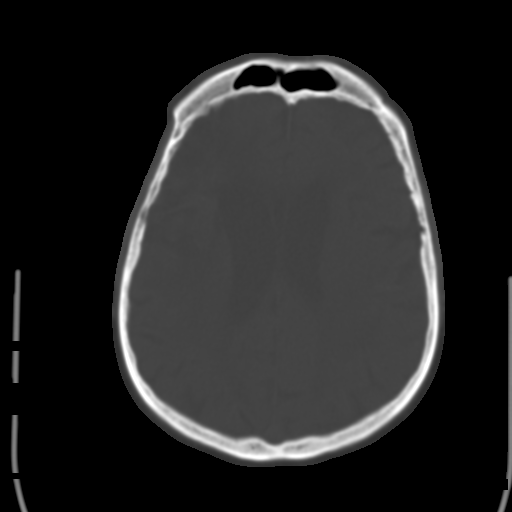
[im 23/34  soft-tissue]
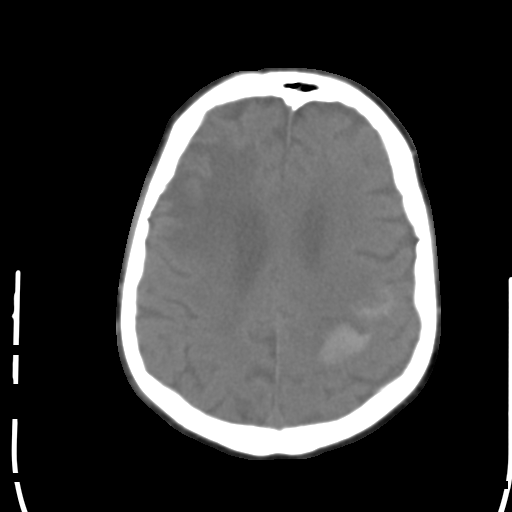
[im 23/34  bone]
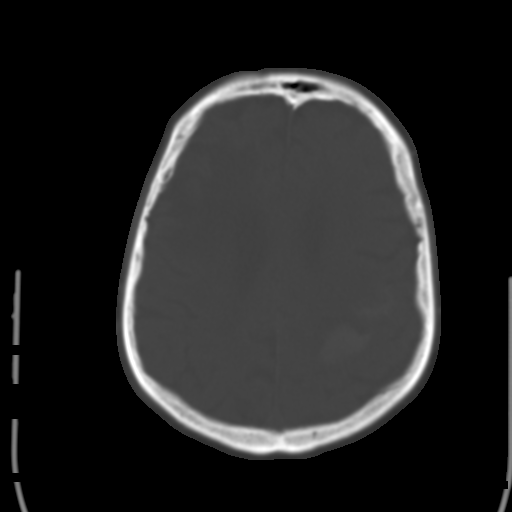
[im 26/34  bone]
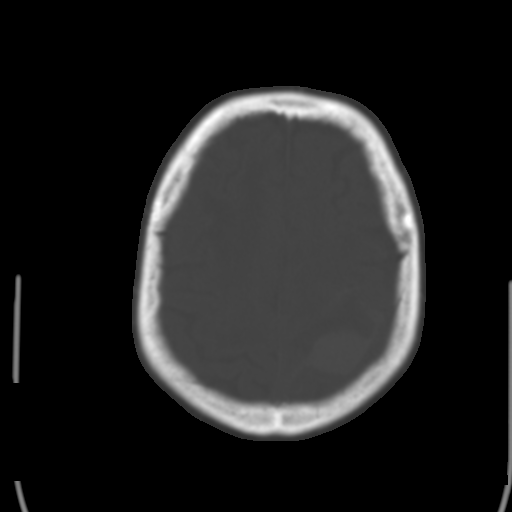
[im 28/34  bone]
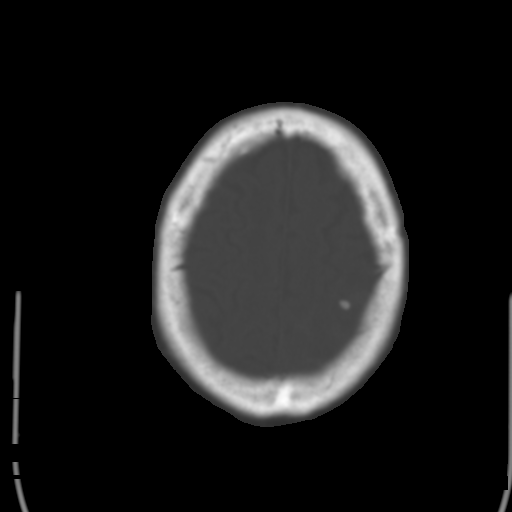
[im 31/34  bone]
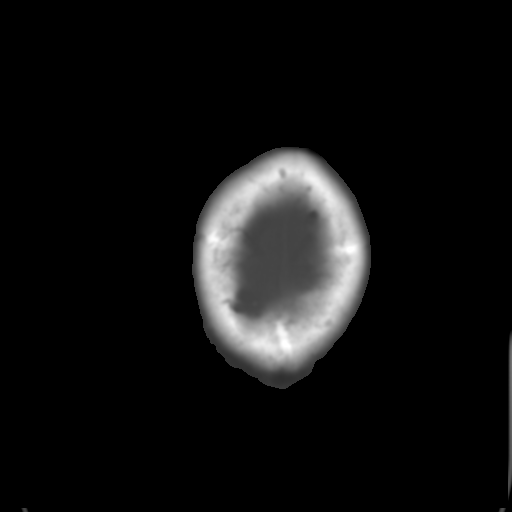

[12 of 14 positions shown; findings below may reference images not displayed]

FINDINGS: 2.0 x 2.8 cm parenchymal hemorrhage in the left frontal lobe (series
4/ image 24), with surrounding vasogenic edema. Associated
subarachnoid hemorrhage (series 4/image 21). No corresponding mass
on prior CT. These findings are compatible with the clinical history
of trauma.

No associated midline shift or intraventricular hemorrhage.

The sesamoid AC changes related to prior right frontal lobe infarct.
Ex vacuo dilatation of the frontal and temporal horns of the right
lateral ventricle.

Subcortical white matter and periventricular small vessel ischemic
changes. Intracranial atherosclerosis.

Global cortical atrophy.

No evidence of calvarial fracture.
IMPRESSION: 2.0 x 2.8 cm parenchymal hemorrhage in the left frontal lobe, likely
post-traumatic given the clinical history. Surrounding vasogenic
edema and associated subarachnoid hemorrhage.

No associated midline shift or intraventricular hemorrhage.

Critical value/emergent results were called by telephone at the time
of interpretation on 06/29/2014 at [DATE] to Dr. MAURISIO KASE , who
verbally acknowledged these results.

## 2015-07-27 IMAGING — CR DG CHEST 1V
1 series · 1 of 1 positions shown · non-contrast
Comparison: Chest radiograph performed 06/15/2014

CLINICAL DATA: Status post fall yesterday. Cannot move right arm
today. Initial encounter.

EXAM:
CHEST - 1 VIEW

[ap]
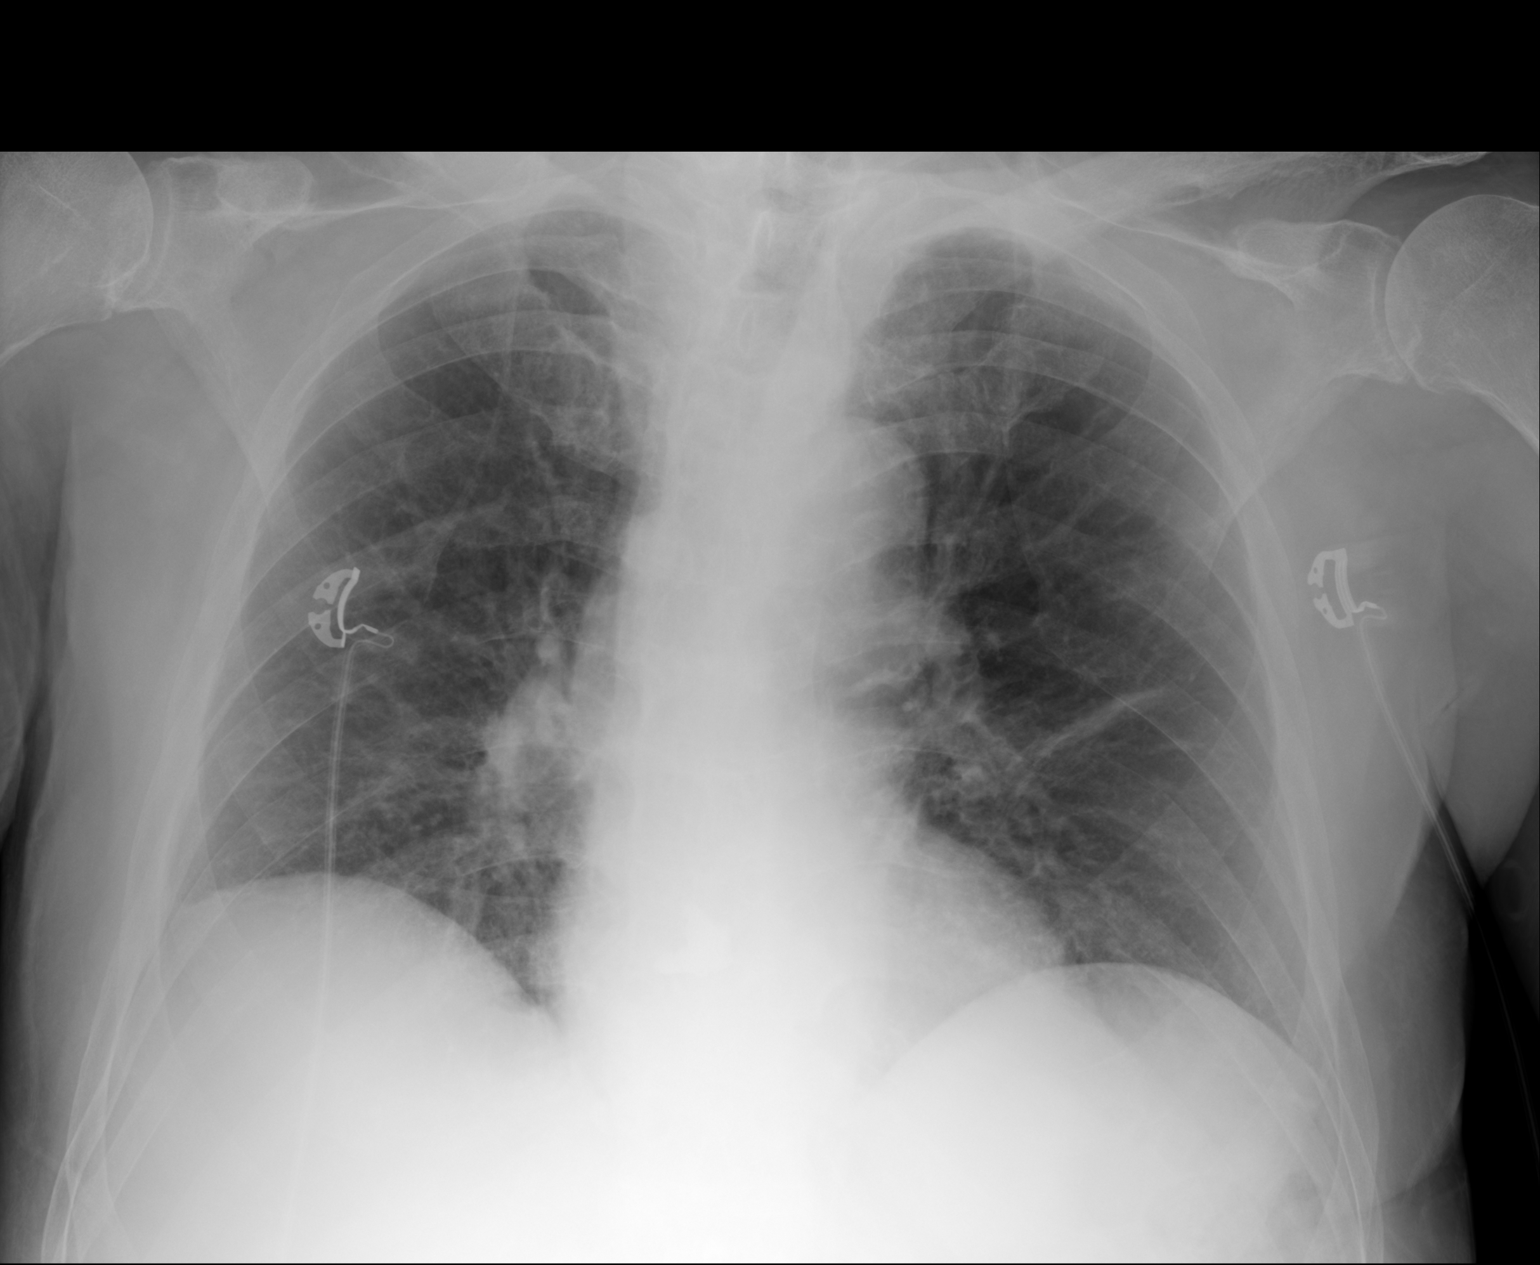

[1 of 1 positions shown; findings below may reference images not displayed]

FINDINGS: The lungs are well-aerated. Mild bilateral atelectasis is noted.
Pulmonary vascularity is at the upper limits of normal. No pleural
effusion or pneumothorax is seen.

The cardiomediastinal silhouette is within normal limits. No acute
osseous abnormalities are seen.
IMPRESSION: Mild bilateral atelectasis noted; lungs otherwise clear. No
displaced rib fractures identified.
